# Patient Record
Sex: Female | Born: 2005 | Race: White | Hispanic: No | Marital: Single | Smoking: Never smoker
Health system: Southern US, Community
[De-identification: ages and names within clinical notes are randomized; demographics above are authoritative.]

## PROBLEM LIST (undated history)

## (undated) DIAGNOSIS — F909 Attention-deficit hyperactivity disorder, unspecified type: Secondary | ICD-10-CM

## (undated) HISTORY — PX: TYMPANOSTOMY TUBE PLACEMENT: SHX32

## (undated) HISTORY — PX: OTHER SURGICAL HISTORY: SHX169

---
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
## 2023-07-04 ENCOUNTER — Other Ambulatory Visit: Payer: Self-pay

## 2023-07-04 ENCOUNTER — Emergency Department
Admission: EM | Admit: 2023-07-04 | Discharge: 2023-07-04 | Disposition: A | Discharge status: Home / Self Care | Attending: Emergency Medicine | Admitting: Emergency Medicine

## 2023-07-04 DIAGNOSIS — Z23 Encounter for immunization: Secondary | ICD-10-CM | POA: Insufficient documentation

## 2023-07-04 DIAGNOSIS — Y93K9 Activity, other involving animal care: Secondary | ICD-10-CM | POA: Insufficient documentation

## 2023-07-04 DIAGNOSIS — S01311A Laceration without foreign body of right ear, initial encounter: Secondary | ICD-10-CM | POA: Diagnosis present

## 2023-07-04 DIAGNOSIS — W540XXA Bitten by dog, initial encounter: Secondary | ICD-10-CM | POA: Insufficient documentation

## 2023-07-04 MED ORDER — KETOROLAC TROMETHAMINE 15 MG/ML IJ SOLN
15.0000 mg | Freq: Once | INTRAMUSCULAR | Status: AC
  Administered 2023-07-04: 15 mg via INTRAMUSCULAR
  Filled 2023-07-04: qty 1

## 2023-07-04 MED ORDER — AMOXICILLIN-POT CLAVULANATE 400-57 MG/5ML PO SUSR
875.0000 mg | Freq: Two times a day (BID) | ORAL | Status: DC
  Administered 2023-07-04: 872 mg via ORAL
  Filled 2023-07-04: qty 10.9

## 2023-07-04 MED ORDER — LIDOCAINE HCL (PF) 1 % IJ SOLN
5.0000 mL | Freq: Once | INTRAMUSCULAR | Status: AC
  Administered 2023-07-04: 5 mL via INTRADERMAL
  Filled 2023-07-04: qty 5

## 2023-07-04 MED ORDER — AMOXICILLIN-POT CLAVULANATE 600-42.9 MG/5ML PO SUSR: 875.0000 mg | Freq: Two times a day (BID) | ORAL | 0 refills | Status: AC

## 2023-07-04 MED ORDER — TETANUS-DIPHTH-ACELL PERTUSSIS 5-2.5-18.5 LF-MCG/0.5 IM SUSY
0.5000 mL | PREFILLED_SYRINGE | Freq: Once | INTRAMUSCULAR | Status: AC
  Administered 2023-07-04: 0.5 mL via INTRAMUSCULAR
  Filled 2023-07-04: qty 0.5

## 2023-07-04 NOTE — ED Provider Notes (Signed)
 [facility] Provider Note    Event Date/Time   First MD Initiated Contact with Patient 07/04/23 2038     (approximate)   History   Ear Laceration and Animal Bite   HPI  Brooke Moran is a 18 y.o. female with PMH of ADHD presents for evaluation of an ear laceration from a dog bite to the right ear.  Patient playing with her own dog when she got close to the dog's face which it did not like and reacted suddenly, biting her right ear.  Dog is up-to-date on rabies vaccines.  Patient is not sure when her last tetanus shot was.      Physical Exam   Triage Vital Signs: ED Triage Vitals  Encounter Vitals Group     BP 07/04/23 2016 131/86     Girls Systolic BP Percentile --      Girls Diastolic BP Percentile --      Boys Systolic BP Percentile --      Boys Diastolic BP Percentile --      Pulse Rate 07/04/23 2016 72     Resp 07/04/23 2016 18     Temp 07/04/23 2016 99.1 F (37.3 C)     Temp Source 07/04/23 2016 Oral     SpO2 07/04/23 2016 95 %     Weight 07/04/23 2010 145 lb (65.8 kg)     Height 07/04/23 2010 5' 5 (1.651 m)     Head Circumference --      Peak Flow --      Pain Score --      Pain Loc --      Pain Education --      Exclude from Growth Chart --     Most recent vital signs: Vitals:   07/04/23 2016  BP: 131/86  Pulse: 72  Resp: 18  Temp: 99.1 F (37.3 C)  SpO2: 95%   General: Awake, no distress.  CV:  Good peripheral perfusion.  Resp:  Normal effort.  Abd:  No distention.  Other:  Through and through laceration through the lobule of the right ear with part of the ear hanging down, less than 1 cm small superficial lacerations to the earlobe, see media portion of patient's chart.   ED Results / Procedures / Treatments   Labs (all labs ordered are listed, but only abnormal results are displayed) Labs Reviewed - No data to display    PROCEDURES:  Critical Care performed: No  .Laceration Repair  Date/Time: 07/04/2023  10:32 PM  Performed by: Cleaster Tinnie LABOR, PA-C Authorized by: Cleaster Tinnie LABOR, PA-C   Consent:    Consent obtained:  Verbal   Consent given by:  Patient   Risks, benefits, and alternatives were discussed: yes     Risks discussed:  Infection   Alternatives discussed:  No treatment Universal protocol:    Patient identity confirmed:  Verbally with patient Anesthesia:    Anesthesia method:  Nerve block   Block location:  Right ear   Block needle gauge:  25 G   Block anesthetic:  Lidocaine 1% w/o epi   Block technique:  Auricular block   Block injection procedure:  Anatomic landmarks identified, introduced needle, negative aspiration for blood and incremental injection   Block outcome:  Anesthesia achieved Laceration details:    Location:  Ear   Ear location:  R ear   Length (cm):  2   Depth (mm):  1 Pre-procedure details:    Preparation:  Patient was  prepped and draped in usual sterile fashion Exploration:    Wound exploration: entire depth of wound visualized   Treatment:    Area cleansed with:  Povidone-iodine   Amount of cleaning:  Standard Skin repair:    Repair method:  Sutures   Suture size:  6-0   Suture material:  Prolene   Suture technique:  Simple interrupted   Number of sutures:  3 Approximation:    Approximation:  Loose Repair type:    Repair type:  Simple Post-procedure details:    Dressing:  Bulky dressing   Procedure completion:  Tolerated well, no immediate complications    MEDICATIONS ORDERED IN ED: Medications  Tdap (BOOSTRIX) injection 0.5 mL (has no administration in time range)  amoxicillin-clavulanate (AUGMENTIN) 400-57 MG/5ML suspension 872 mg (has no administration in time range)  ketorolac (TORADOL) 15 MG/ML injection 15 mg (15 mg Intramuscular Given 07/04/23 2103)  lidocaine (PF) (XYLOCAINE) 1 % injection 5 mL (5 mLs Intradermal Given 07/04/23 2104)     IMPRESSION / MDM / ASSESSMENT AND PLAN / ED COURSE  I reviewed the triage vital  signs and the nursing notes.                             18 year old female presents for evaluation of an ear laceration after dog bite.  Vital signs are stable patient a bit tearful on exam otherwise NAD.  Differential diagnosis includes, but is not limited to, laceration, tetanus prophylaxis.  Patient's presentation is most consistent with acute, uncomplicated illness.  Patient's dog is up-to-date on the rabies vaccine and this does sound like it was a provoked attack.  No indication for rabies series at today's visit.  Will update her tetanus shot.  The smaller lacerations on patient's ear do not need to be closed.  Patient does have a through and through laceration through the lobule that is hanging down.  For best cosmetic outcome I would recommend that we suture the ear.  Patient was agreeable.  See procedure note for further details.  Explained to the patient that closing a dog bite does increase the chances of infection, so she will need close follow-up with ENT to ensure good wound healing.  Will place her on oral antibiotics as well.  He was given a first dose while in the emergency department.  We discussed wound care.  She voiced understanding, all questions were answered and she is stable at discharge.       FINAL CLINICAL IMPRESSION(S) / ED DIAGNOSES   Final diagnoses:  Dog bite, initial encounter  Ear lobe laceration, right, initial encounter     Rx / DC Orders   ED Discharge Orders          Ordered    amoxicillin-clavulanate (AUGMENTIN) 600-42.9 MG/5ML suspension  2 times daily        07/04/23 2231             Note:  This document was prepared using Dragon voice recognition software and may include unintentional dictation errors.   Cleaster Tinnie LABOR, PA-C 07/04/23 2234    Malvina Alm DASEN, MD 07/04/23 [phone]

## 2023-07-04 NOTE — Discharge Instructions (Addendum)
 The stitches will need to be removed in 5-7 days, this can be done by your primary care provider, urgent care, the emergency department or the ear nose and throat doctor.  Wash the wound with soap and water daily.  Pat dry and cover with a bandage if you would like.  The wounds that were not sutured closed, you can use triple antibiotic ointment on.  Please schedule follow-up with the ear nose and throat doctor.  Information is attached, call to schedule.  Take the antibiotics as prescribed.  Make sure you take all of the medication even if you begin to feel better.  Since this wound was caused by a dog bite, it is high risk for infection. Watch for signs of infection including redness, warmth, swelling, pain and pus drainage.  If you develop any of these please return to the ED, urgent care or your primary care provider.

## 2023-07-04 NOTE — ED Triage Notes (Signed)
 Pt to ED via POV c/o dog bite to right ear. Pt states she was playing with dog when she did domething that the dog did not like and it bit her right ear. Part of ear is hanging. Dog UTD with shots

## 2023-07-13 ENCOUNTER — Telehealth: Payer: Self-pay | Admitting: Student

## 2023-07-13 MED ORDER — FLUCONAZOLE 150 MG PO TABS: 150.0000 mg | ORAL_TABLET | Freq: Every day | ORAL | 0 refills | Status: AC

## 2023-07-13 NOTE — Telephone Encounter (Cosign Needed)
 Patient was started on antibiotics after a dog bite and now has a yeast infection, will prescribe an antifungal.

## 2024-01-08 ENCOUNTER — Encounter: Payer: Self-pay | Admitting: Emergency Medicine

## 2024-01-08 ENCOUNTER — Emergency Department

## 2024-01-08 ENCOUNTER — Other Ambulatory Visit: Payer: Self-pay

## 2024-01-08 DIAGNOSIS — J101 Influenza due to other identified influenza virus with other respiratory manifestations: Secondary | ICD-10-CM | POA: Diagnosis not present

## 2024-01-08 DIAGNOSIS — R059 Cough, unspecified: Secondary | ICD-10-CM | POA: Diagnosis present

## 2024-01-08 LAB — CBC
HCT: 36 % (ref 36.0–46.0)
Hemoglobin: 12.4 g/dL (ref 12.0–15.0)
MCH: 29.6 pg (ref 26.0–34.0)
MCHC: 34.4 g/dL (ref 30.0–36.0)
MCV: 85.9 fL (ref 80.0–100.0)
Platelets: 246 K/uL (ref 150–400)
RBC: 4.19 MIL/uL (ref 3.87–5.11)
RDW: 12.5 % (ref 11.5–15.5)
WBC: 5.7 K/uL (ref 4.0–10.5)
nRBC: 0 % (ref 0.0–0.2)

## 2024-01-08 LAB — BASIC METABOLIC PANEL WITH GFR
Anion gap: 13 (ref 5–15)
BUN: 6 mg/dL (ref 6–20)
CO2: 20 mmol/L — ABNORMAL LOW (ref 22–32)
Calcium: 8.3 mg/dL — ABNORMAL LOW (ref 8.9–10.3)
Chloride: 104 mmol/L (ref 98–111)
Creatinine, Ser: 0.67 mg/dL (ref 0.44–1.00)
GFR, Estimated: >60 mL/min
Glucose, Bld: 103 mg/dL — ABNORMAL HIGH (ref 70–99)
Potassium: 3.7 mmol/L (ref 3.5–5.1)
Sodium: 136 mmol/L (ref 135–145)

## 2024-01-08 LAB — TROPONIN T, HIGH SENSITIVITY: Troponin T High Sensitivity: <15 ng/L (ref 0–19)

## 2024-01-08 MED ORDER — ACETAMINOPHEN 160 MG/5ML PO SUSP
ORAL | Status: AC
  Administered 2024-01-08: 650 mg via ORAL
  Filled 2024-01-08: qty 25

## 2024-01-08 MED ORDER — ACETAMINOPHEN 160 MG/5ML PO SOLN
650.0000 mg | Freq: Once | ORAL | Status: AC
  Filled 2024-01-08: qty 20.3

## 2024-01-08 MED ORDER — ACETAMINOPHEN 325 MG PO TABS
650.0000 mg | ORAL_TABLET | Freq: Once | ORAL | Status: DC | PRN
  Filled 2024-01-08: qty 2

## 2024-01-08 NOTE — ED Triage Notes (Addendum)
 Pt arrived via POV with reports of flu +, coughing and chest pain. Sxs began yesterday, taking OTC meds at home.

## 2024-01-09 ENCOUNTER — Emergency Department: Admission: EM | Admit: 2024-01-09 | Discharge: 2024-01-09 | Disposition: A

## 2024-01-09 DIAGNOSIS — J101 Influenza due to other identified influenza virus with other respiratory manifestations: Secondary | ICD-10-CM

## 2024-01-09 LAB — TROPONIN T, HIGH SENSITIVITY: Troponin T High Sensitivity: <15 ng/L (ref 0–19)

## 2024-01-09 MED ORDER — OSELTAMIVIR PHOSPHATE 6 MG/ML PO SUSR: 75.0000 mg | Freq: Two times a day (BID) | ORAL | 0 refills | Status: AC

## 2024-01-09 MED ORDER — GUAIFENESIN 100 MG/5ML PO LIQD
5.0000 mL | Freq: Once | ORAL | Status: AC
  Administered 2024-01-09: 5 mL via ORAL
  Filled 2024-01-09: qty 10

## 2024-01-09 MED ORDER — GUAIFENESIN-DM 100-10 MG/5ML PO SYRP: 5.0000 mL | ORAL_SOLUTION | ORAL | 0 refills | Status: AC | PRN

## 2024-01-09 MED ORDER — ACETAMINOPHEN 160 MG/5ML PO SUSP: 1000.0000 mg | Freq: Four times a day (QID) | ORAL | 0 refills | Status: AC | PRN

## 2024-01-09 MED ORDER — IBUPROFEN 100 MG/5ML PO SUSP: 600.0000 mg | Freq: Three times a day (TID) | ORAL | 0 refills | Status: AC | PRN

## 2024-01-09 NOTE — ED Provider Notes (Signed)
 "  [facility] Provider Note    Event Date/Time   First MD Initiated Contact with Patient 01/09/24 0115     (approximate)   History   Cough and Chest Pain  Pt arrived via POV with reports of flu +, coughing and chest pain. Sxs began yesterday, taking OTC meds at home.   HPI Brooke Moran is a 18 y.o. female no related past medical history presents for evaluation of cough, chest discomfort - Patient's grandmother was recently admitted for the flu.  Patient and her mother tested positive for the flu earlier today.  Has been having cough, body aches, fatigue over the past day or so, developed a fever while here at the emergency department. -Notes some chest discomfort when she coughs -No vomiting or abdominal pain -States no possibility she could be pregnant     Physical Exam   Triage Vital Signs: ED Triage Vitals  Encounter Vitals Group     BP 01/08/24 2036 116/77     Girls Systolic BP Percentile --      Girls Diastolic BP Percentile --      Boys Systolic BP Percentile --      Boys Diastolic BP Percentile --      Pulse Rate 01/08/24 2036 (!) 117     Resp 01/08/24 2036 18     Temp 01/08/24 2036 (!) 101.8 F (38.8 C)     Temp Source 01/08/24 2036 Oral     SpO2 01/08/24 2036 97 %     Weight 01/08/24 2036 160 lb (72.6 kg)     Height 01/08/24 2036 5' 5 (1.651 m)     Head Circumference --      Peak Flow --      Pain Score 01/08/24 2035 8     Pain Loc --      Pain Education --      Exclude from Growth Chart --     Most recent vital signs: Vitals:   01/08/24 2205 01/09/24 0031  BP: 111/77 106/72  Pulse: 100 98  Resp: 20 18  Temp: 100.1 F (37.8 C) 98.8 F (37.1 C)  SpO2: 99% 97%     General: Awake, no distress.  CV:  Good peripheral perfusion. RRR (heart rate 90s), RP 2+ Resp:  Normal effort. CTAB Abd:  No distention. Nontender to deep palpation throughout    ED Results / Procedures / Treatments   Labs (all labs ordered are listed,  but only abnormal results are displayed) Labs Reviewed  BASIC METABOLIC PANEL WITH GFR - Abnormal; Notable for the following components:      Result Value   CO2 20 (*)    Glucose, Bld 103 (*)    Calcium 8.3 (*)    All other components within normal limits  CBC  POC URINE PREG, ED  TROPONIN T, HIGH SENSITIVITY  TROPONIN T, HIGH SENSITIVITY     EKG  See ED course below   RADIOLOGY Radiology interpreted by myself radiology report reviewed.  No acute pathology identified.   PROCEDURES:  Critical Care performed: No  Procedures   MEDICATIONS ORDERED IN ED: Medications  acetaminophen  (TYLENOL ) 160 MG/5ML solution 650 mg (650 mg Oral Given 01/08/24 2049)  guaiFENesin  (ROBITUSSIN) 100 MG/5ML liquid 5 mL (5 mLs Oral Given 01/09/24 0235)     IMPRESSION / MDM / ASSESSMENT AND PLAN / ED COURSE  I reviewed the triage vital signs and the nursing notes.  DDX/MDM/AP: Differential diagnosis includes, but is not limited to, cough and discomfort secondary to influenza.  Highly doubt underlying cardiac etiology, pneumothorax, pneumonia given short segment symptoms and presentation most consistent with costochondritis/pleurisy.  Plan: - Labs, imaging ordered from triage, unremarkable - EKG  Patient's presentation is most consistent with acute complicated illness / injury requiring diagnostic workup.   ED course below.  Laboratory workup unremarkable.  Chest x-ray normal.  Patient did already test positive for flu at home test, no indication for repeat at this time.  Would like to initiate Tamiflu , is within the timeframe.  Counseled on symptomatic management, oral hydration, expectant care.  Plan for PMD follow-up for any ongoing symptoms outside of the usual recovery timeframe.  ED return precautions in place.  Patient and family agree with plan.  Rx Tylenol , Motrin , Tamiflu , Robitussin.  Patient apparently cannot take pills, prescribe suspension of all  medications.  Clinical Course as of 01/09/24 9762  [place] Jan 09, 2024  0204 CXR: IMPRESSION: No active cardiopulmonary disease.   [MM]  0204 CBC, BMP reviewed, overall unremarkable [MM]  0205 Trop wnl [MM]  0205 Ecg = sinus tachycardia, rate 123, no gross ST elevation or depression, no significant repolarization abnormality, rightward axis, normal intervals.  No clear evidence of ischemia or arrhythmia. [MM]    Clinical Course User Index [MM] Clarine Ozell LABOR, MD     FINAL CLINICAL IMPRESSION(S) / ED DIAGNOSES   Final diagnoses:  Influenza A     Rx / DC Orders   ED Discharge Orders          Ordered    acetaminophen  (TYLENOL  CHILDRENS) 160 MG/5ML suspension  Every 6 hours PRN        01/09/24 0233    ibuprofen  (ADVIL ) 100 MG/5ML suspension  Every 8 hours PRN        01/09/24 0233    oseltamivir  (TAMIFLU ) 6 MG/ML SUSR suspension  2 times daily        01/09/24 0233    guaiFENesin -dextromethorphan (ROBITUSSIN DM) 100-10 MG/5ML syrup  Every 4 hours PRN        01/09/24 9766             Note:  This document was prepared using Dragon voice recognition software and may include unintentional dictation errors.   Clarine Ozell LABOR, MD 01/09/24 [id number]  "

## 2024-01-09 NOTE — Discharge Instructions (Addendum)
 Your evaluation in the emergency department was overall reassuring.  I believe your symptoms are due to influenza, and I prescribed you Tylenol , Motrin , Tamiflu , and a cough syrup to help with this.  You should start to improve within 3-5 days.  Please do follow-up with your primary care provider for any ongoing symptoms, and return to the emergency department with any new or worsening symptoms.
